# Patient Record
Sex: Male | Born: 1995 | Race: White | Hispanic: No | Marital: Single | Smoking: Never smoker
Health system: Southern US, Community
[De-identification: ages and names within clinical notes are randomized; demographics above are authoritative.]

## PROBLEM LIST (undated history)

## (undated) ENCOUNTER — Emergency Department (HOSPITAL_COMMUNITY): Discharge status: Against Medical Advice | Payer: 59

---
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
## 2021-01-11 ENCOUNTER — Other Ambulatory Visit: Payer: Self-pay

## 2021-01-11 DIAGNOSIS — R072 Precordial pain: Secondary | ICD-10-CM | POA: Insufficient documentation

## 2021-01-11 DIAGNOSIS — F17220 Nicotine dependence, chewing tobacco, uncomplicated: Secondary | ICD-10-CM | POA: Diagnosis not present

## 2021-01-11 DIAGNOSIS — R079 Chest pain, unspecified: Secondary | ICD-10-CM | POA: Diagnosis present

## 2021-01-12 ENCOUNTER — Other Ambulatory Visit: Payer: Self-pay

## 2021-01-12 ENCOUNTER — Encounter (HOSPITAL_COMMUNITY): Payer: Self-pay | Admitting: Emergency Medicine

## 2021-01-12 ENCOUNTER — Emergency Department (HOSPITAL_COMMUNITY)
Admission: EM | Admit: 2021-01-12 | Discharge: 2021-01-12 | Disposition: A | Discharge status: Home / Self Care | Payer: Managed Care, Other (non HMO) | Attending: Emergency Medicine | Admitting: Emergency Medicine

## 2021-01-12 ENCOUNTER — Emergency Department (HOSPITAL_COMMUNITY): Payer: Managed Care, Other (non HMO)

## 2021-01-12 DIAGNOSIS — R072 Precordial pain: Secondary | ICD-10-CM

## 2021-01-12 LAB — TROPONIN I (HIGH SENSITIVITY)
Troponin I (High Sensitivity): 2 ng/L (ref <18)
Troponin I (High Sensitivity): <2 ng/L (ref <18)

## 2021-01-12 LAB — BASIC METABOLIC PANEL
Anion gap: 9 (ref 5–15)
BUN: 13 mg/dL (ref 6–20)
CO2: 26 mmol/L (ref 22–32)
Calcium: 9.5 mg/dL (ref 8.9–10.3)
Chloride: 103 mmol/L (ref 98–111)
Creatinine, Ser: 1.03 mg/dL (ref 0.61–1.24)
GFR, Estimated: >60 mL/min (ref >60)
Glucose, Bld: 100 mg/dL — ABNORMAL HIGH (ref 70–99)
Potassium: 4.1 mmol/L (ref 3.5–5.1)
Sodium: 138 mmol/L (ref 135–145)

## 2021-01-12 LAB — CBC
HCT: 44.6 % (ref 39.0–52.0)
Hemoglobin: 14.9 g/dL (ref 13.0–17.0)
MCH: 30.5 pg (ref 26.0–34.0)
MCHC: 33.4 g/dL (ref 30.0–36.0)
MCV: 91.4 fL (ref 80.0–100.0)
Platelets: 348 10*3/uL (ref 150–400)
RBC: 4.88 MIL/uL (ref 4.22–5.81)
RDW: 12.5 % (ref 11.5–15.5)
WBC: 6.9 10*3/uL (ref 4.0–10.5)
nRBC: 0 % (ref 0.0–0.2)

## 2021-01-12 LAB — D-DIMER, QUANTITATIVE: D-Dimer, Quant: <0.27 ug/mL-FEU (ref 0.00–0.50)

## 2021-01-12 NOTE — ED Notes (Signed)
Patient verbalizes understanding of discharge instructions. Opportunity for questioning and answers were provided. Armband removed by staff, pt discharged from ED ambulatory.   

## 2021-01-12 NOTE — ED Provider Notes (Signed)
[facility] EMERGENCY DEPARTMENT Provider Note   CSN: [id number] Arrival date & time: 01/11/21  2356     History Chief Complaint  Patient presents with  . Chest Pain    Kyle Ho is a 25 y.o. male.  Patient presents to the emergency department with a chief complaint of left-sided chest pain.  He states that he noticed the symptoms tonight while he was driving.  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  He states that the symptoms have improved.  He denies feeling short of breath.  Denies fever, chills, or cough.  He denies any history of PE or DVT.  Denies any leg or calf swelling or pain.  Denies any trauma.  The history is provided by the patient. No language interpreter was used.       History reviewed. No pertinent past medical history.  Patient Active Problem List   Diagnosis Date Noted  . 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 Encounter for routine child health examination without abnormal findings 07/09/2015    History reviewed. No pertinent surgical history.     Family History  Problem Relation Age of Onset  . Thyroid disease Mother   . Diabetes Father     Social History   Tobacco Use  . Smoking status: Never Smoker  . Smokeless tobacco: Former Neurosurgeon    Types: Chew  Substance Use Topics  . Alcohol use: No  . Drug use: No    Home Medications Prior to Admission medications   Not on File    Allergies    Patient has no known allergies.  Review of Systems   Review of Systems  All other systems reviewed and are negative.   Physical Exam Updated Vital Signs BP 116/77   Pulse 64   Temp 97.6 F (36.4 C) (Oral)   Resp 20   Ht 5\' 9"  (1.753 m)   Wt 68 kg   SpO2 96%   BMI 22.15 kg/m   Physical Exam Vitals and nursing note reviewed.  Constitutional:      Appearance: He is well-developed and well-nourished.  HENT:     Head: Normocephalic and atraumatic.  Eyes:     Conjunctiva/sclera:  Conjunctivae normal.  Cardiovascular:     Rate and Rhythm: Normal rate and regular rhythm.     Heart sounds: No murmur heard.   Pulmonary:     Effort: Pulmonary effort is normal. No respiratory distress.     Breath sounds: Normal breath sounds.  Abdominal:     Palpations: Abdomen is soft.     Tenderness: There is no abdominal tenderness.  Musculoskeletal:        General: No edema. Normal range of motion.     Cervical back: Neck supple.  Skin:    General: Skin is warm and dry.  Neurological:     General: No focal deficit present.     Mental Status: He is alert and oriented to person, place, and time.  Psychiatric:        Mood and Affect: Mood and affect and mood normal.     ED Results / Procedures / Treatments   Labs (all labs ordered are listed, but only abnormal results are displayed) Labs Reviewed  BASIC METABOLIC PANEL - Abnormal; Notable for the following components:      Result Value   Glucose, Bld 100 (*)    All other components within normal limits  CBC  D-DIMER, QUANTITATIVE  TROPONIN I (HIGH SENSITIVITY)  TROPONIN I (HIGH SENSITIVITY)    EKG None  Radiology DG Chest 2 View  Result Date: 01/12/2021 CLINICAL DATA:  Chest pain and shortness of breath EXAM: CHEST - 2 VIEW COMPARISON:  None. FINDINGS: Heart size is normal. Mediastinal shadows are normal. The lungs are clear. No bronchial thickening. No infiltrate, mass, effusion or collapse. Pulmonary vascularity is normal. No bony abnormality. IMPRESSION: Normal chest. Electronically Signed   By: Paulina Fusi M.D.   On: 01/12/2021 00:40    Procedures Procedures   Medications Ordered in ED Medications - No data to display  ED Course  I have reviewed the triage vital signs and the nursing notes.  Pertinent labs & imaging results that were available during my care of the patient were reviewed by me and considered in my medical decision making (see chart for details).    MDM Rules/Calculators/A&P                           Patient here with chest pain that started earlier today.  It is now resolved.  Patient is young and otherwise healthy.  He does work as a Radio broadcast assistant, and spends a lot of time seated and driving.  He does not have any lower extremity pain or swelling.  No calf tenderness.  No history of PE or DVT.  D-dimer is negative, I doubt PE.  Troponins are negative, EKG shows no acute ischemic changes.  ACS is very low on the differential given age and lack of risk factors.  Patient did have some reproduction of the symptoms when he was pressing on his chest earlier, but there is no reproduction of symptoms when I press on his chest.  Question musculoskeletal etiology.  No cough, fever, or shortness of breath, doubt infectious etiology.  Chest x-ray shows no pneumothorax.  At this time, unclear explanation for the patient's symptoms, but does not appear to be any emergent etiologies, and I feel the patient can be safely discharged home. Final Clinical Impression(s) / ED Diagnoses Final diagnoses:  Precordial pain    Rx / DC Orders ED Discharge Orders    None       Roxy Horseman, PA-C 01/12/21 0425    Gilda Crease, MD 01/12/21 [phone]

## 2021-01-12 NOTE — ED Triage Notes (Signed)
Patient with chest pain and shortness of breath.  He states that he got the pains all of a sudden while he was driving.  Patient did have some of the pain radiate into his neck and felt like his face went numb.  All has subsided at this time and the pains come and go every so often.

## 2021-01-30 ENCOUNTER — Ambulatory Visit: Admitting: Nurse Practitioner

## 2022-09-03 IMAGING — CR DG CHEST 2V
2 series · 2 of 2 positions shown · non-contrast
Comparison: None.

CLINICAL DATA: Chest pain and shortness of breath

EXAM:
CHEST - 2 VIEW

[chest pa]
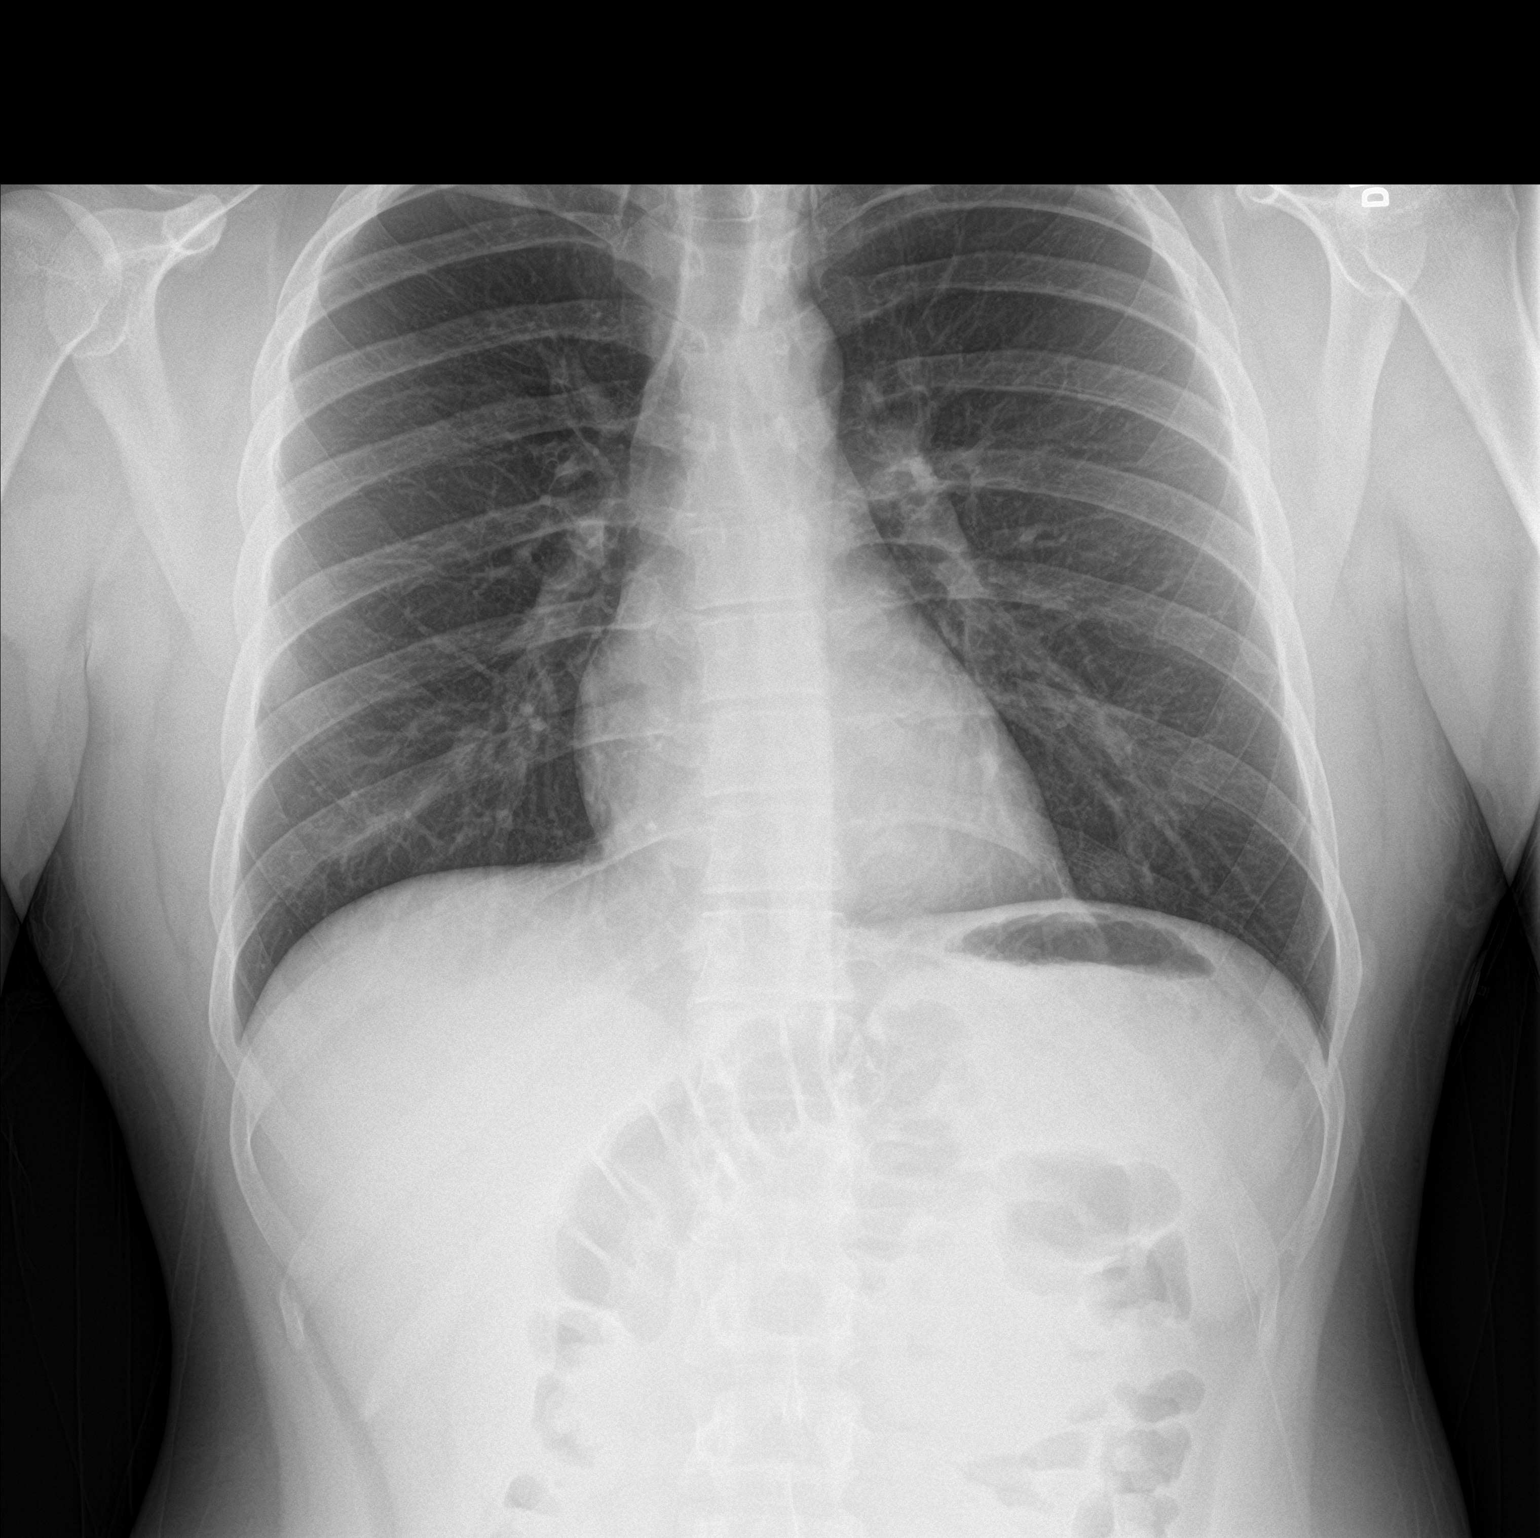

[chest lat]
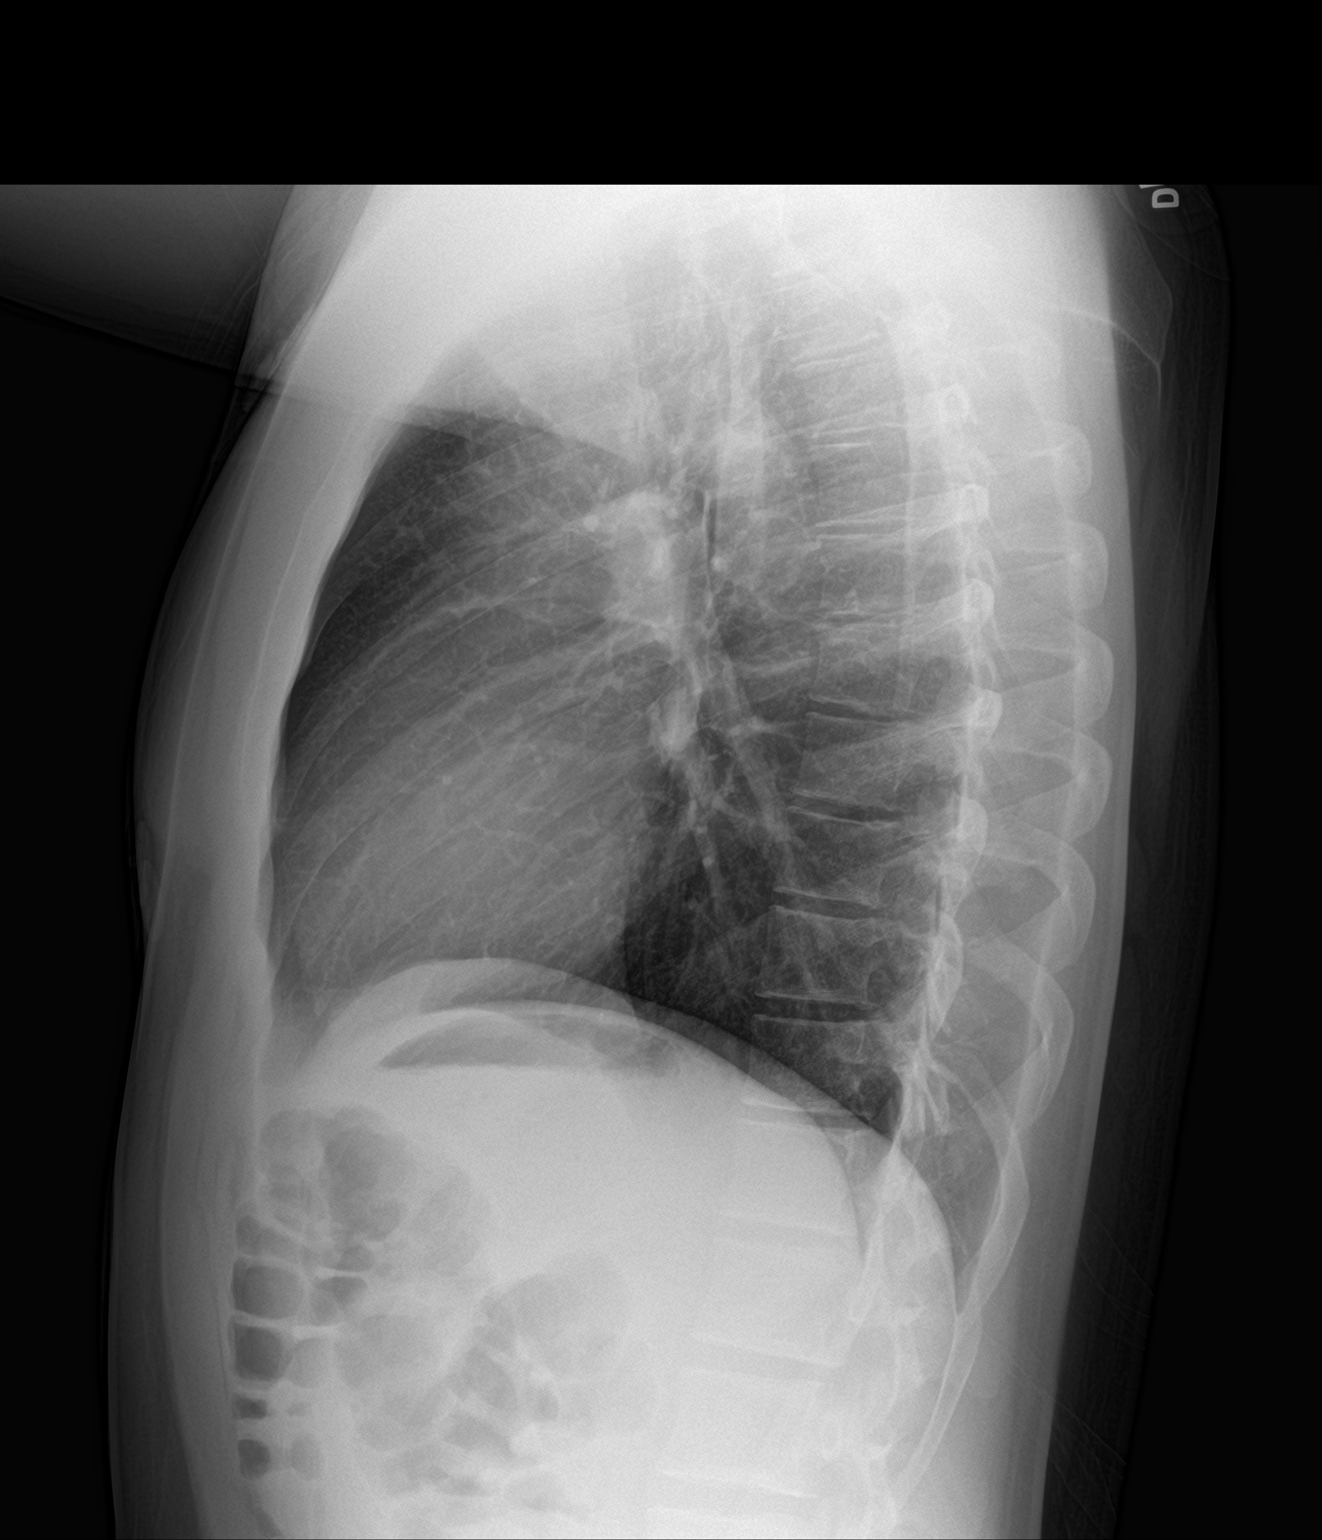

[2 of 2 positions shown; findings below may reference images not displayed]

FINDINGS: Heart size is normal. Mediastinal shadows are normal. The lungs are
clear. No bronchial thickening. No infiltrate, mass, effusion or
collapse. Pulmonary vascularity is normal. No bony abnormality.
IMPRESSION: Normal chest.

## 2024-01-09 ENCOUNTER — Ambulatory Visit
Admission: EM | Admit: 2024-01-09 | Discharge: 2024-01-09 | Disposition: A | Discharge status: Home / Self Care | Payer: Managed Care, Other (non HMO) | Attending: Emergency Medicine | Admitting: Emergency Medicine

## 2024-01-09 DIAGNOSIS — R591 Generalized enlarged lymph nodes: Secondary | ICD-10-CM

## 2024-01-09 LAB — POCT RAPID STREP A (OFFICE): Rapid Strep A Screen: NEGATIVE

## 2024-01-09 NOTE — ED Triage Notes (Signed)
 Pt states he is having swelling and pain on left side of neck that started last night.   Pt states he had a pain on his left side of his neck a month ago and since then he is having pain randomly that comes and goes.

## 2024-01-09 NOTE — Discharge Instructions (Addendum)
 The strep test is negative.  Take Tylenol or ibuprofen as needed for fever or discomfort.    Establish a primary care provider soon as possible.

## 2024-01-09 NOTE — ED Provider Notes (Signed)
 [name]    CSN: [id number] Arrival date & time: 01/09/24  1427      History   Chief Complaint Chief Complaint  Patient presents with   Sore Throat    HPI Kyle Ho is a 28 y.o. male.  Patient presents with painful swollen lymph node on the left side of his neck since last night.  No fever, sore throat, difficulty swallowing, cough, shortness of breath.  No OTC medications taken.  Patient reports approximately 1 to 2 months ago he had pain in the same area that was brief and resolved spontaneously.  He does not currently have a PCP.  The history is provided by the patient and medical records.    History reviewed. No pertinent past medical history.  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 No pertinent surgical history.     Home Medications    Prior to Admission medications   Not on File    Family History Family History  Problem Relation Age of Onset   Thyroid disease Mother    Diabetes Father     Social History Social History   Tobacco Use   Smoking status: Never   Smokeless tobacco: Current    Types: Chew    Last attempt to quit: 06/08/2015  Vaping Use   Vaping status: Never Used  Substance Use Topics   Alcohol use: No   Drug use: No     Allergies   Patient has no known allergies.   Review of Systems Review of Systems  Constitutional:  Negative for chills and fever.  HENT:  Negative for ear pain, sore throat, trouble swallowing and voice change.   Respiratory:  Negative for cough and shortness of breath.      Physical Exam Triage Vital Signs ED Triage Vitals  Encounter Vitals Group     BP 01/09/24 1508 (!) 119/57     Systolic BP Percentile --      Diastolic BP Percentile --      Pulse Rate 01/09/24 1508 68     Resp 01/09/24 1508 16     Temp 01/09/24 1508 98.6 F (37 C)      Temp Source 01/09/24 1508 Oral     SpO2 01/09/24 1508 99 %     Weight --      Height --      Head Circumference --      Peak Flow --      Pain Score 01/09/24 1509 0     Pain Loc --      Pain Education --      Exclude from Growth Chart --    No data found.  Updated Vital Signs BP (!) 119/57 (BP Location: Left Arm)   Pulse 68   Temp 98.6 F (37 C) (Oral)   Resp 16   SpO2 99%   Visual Acuity Right Eye Distance:   Left Eye Distance:   Bilateral Distance:    Right Eye Near:   Left Eye Near:    Bilateral Near:     Physical Exam Constitutional:      General: He is not in acute distress. HENT:     Right Ear: Tympanic membrane normal.     Left Ear: Tympanic membrane normal.     Nose: Nose normal.     Mouth/Throat:     Mouth: Mucous  membranes are moist.     Pharynx: Oropharynx is clear.  Neck:     Comments: Tender enlarged left submandibular lymph node. Cardiovascular:     Rate and Rhythm: Normal rate and regular rhythm.     Heart sounds: Normal heart sounds.  Pulmonary:     Effort: Pulmonary effort is normal. No respiratory distress.     Breath sounds: Normal breath sounds.  Musculoskeletal:     Cervical back: Normal range of motion and neck supple.  Lymphadenopathy:     Cervical: Cervical adenopathy present.  Neurological:     Mental Status: He is alert.      UC Treatments / Results  Labs (all labs ordered are listed, but only abnormal results are displayed) Labs Reviewed  POCT RAPID STREP A (OFFICE) - Normal    EKG   Radiology No results found.  Procedures Procedures (including critical care time)  Medications Ordered in UC Medications - No data to display  Initial Impression / Assessment and Plan / UC Course  I have reviewed the triage vital signs and the nursing notes.  Pertinent labs & imaging results that were available during my care of the patient were reviewed by me and considered in my medical decision making (see chart for  details).    Lymphadenopathy.  Rapid strep negative.  Afebrile and vital signs are stable.  Education provided on lymphadenopathy.  Patient declines assistance with establishing a PCP.  He plans to call his previous PCP on Monday and see if he can reestablish there.  Instructed him to follow-up with PCP if his lymphadenopathy has not resolved in 1 to 2 weeks.  Return and ED precautions discussed.  He agrees to plan of care.  Final Clinical Impressions(s) / UC Diagnoses   Final diagnoses:  Lymphadenopathy     Discharge Instructions      The strep test is negative.  Take Tylenol or ibuprofen as needed for fever or discomfort.    Establish a primary care provider soon as possible.     ED Prescriptions   None    PDMP not reviewed this encounter.   Mickie Bail, NP 01/09/24 [phone]
